# Patient Record
Sex: Male | Born: 1990 | Race: Black or African American | Hispanic: No | Marital: Single | Smoking: Current some day smoker
Health system: Southern US, Community
[De-identification: ages and names within clinical notes are randomized; demographics above are authoritative.]

## PROBLEM LIST (undated history)

## (undated) DIAGNOSIS — M5430 Sciatica, unspecified side: Secondary | ICD-10-CM

## (undated) DIAGNOSIS — G2581 Restless legs syndrome: Secondary | ICD-10-CM

## (undated) DIAGNOSIS — S79912A Unspecified injury of left hip, initial encounter: Secondary | ICD-10-CM

---

## 2018-01-13 ENCOUNTER — Emergency Department (HOSPITAL_COMMUNITY): Payer: Self-pay

## 2018-01-13 ENCOUNTER — Other Ambulatory Visit: Payer: Self-pay

## 2018-01-13 ENCOUNTER — Encounter (HOSPITAL_COMMUNITY): Payer: Self-pay | Admitting: Emergency Medicine

## 2018-01-13 ENCOUNTER — Emergency Department (HOSPITAL_COMMUNITY)
Admission: EM | Admit: 2018-01-13 | Discharge: 2018-01-13 | Disposition: A | Discharge status: Home / Self Care | Payer: Self-pay | Attending: Emergency Medicine | Admitting: Emergency Medicine

## 2018-01-13 DIAGNOSIS — T1490XA Injury, unspecified, initial encounter: Secondary | ICD-10-CM

## 2018-01-13 DIAGNOSIS — Y9301 Activity, walking, marching and hiking: Secondary | ICD-10-CM | POA: Insufficient documentation

## 2018-01-13 DIAGNOSIS — M545 Low back pain, unspecified: Secondary | ICD-10-CM

## 2018-01-13 DIAGNOSIS — M546 Pain in thoracic spine: Secondary | ICD-10-CM | POA: Insufficient documentation

## 2018-01-13 DIAGNOSIS — Y999 Unspecified external cause status: Secondary | ICD-10-CM | POA: Insufficient documentation

## 2018-01-13 DIAGNOSIS — F17200 Nicotine dependence, unspecified, uncomplicated: Secondary | ICD-10-CM | POA: Insufficient documentation

## 2018-01-13 DIAGNOSIS — M542 Cervicalgia: Secondary | ICD-10-CM | POA: Insufficient documentation

## 2018-01-13 DIAGNOSIS — Y929 Unspecified place or not applicable: Secondary | ICD-10-CM | POA: Insufficient documentation

## 2018-01-13 DIAGNOSIS — M79605 Pain in left leg: Secondary | ICD-10-CM | POA: Insufficient documentation

## 2018-01-13 DIAGNOSIS — M79604 Pain in right leg: Secondary | ICD-10-CM | POA: Insufficient documentation

## 2018-01-13 HISTORY — DX: Restless legs syndrome: G25.81

## 2018-01-13 HISTORY — DX: Unspecified injury of left hip, initial encounter: S79.912A

## 2018-01-13 HISTORY — DX: Sciatica, unspecified side: M54.30

## 2018-01-13 LAB — CBC
HEMATOCRIT: 42 % (ref 39.0–52.0)
HEMOGLOBIN: 13.8 g/dL (ref 13.0–17.0)
MCH: 26.7 pg (ref 26.0–34.0)
MCHC: 32.9 g/dL (ref 30.0–36.0)
MCV: 81.2 fL (ref 78.0–100.0)
Platelets: 298 10*3/uL (ref 150–400)
RBC: 5.17 MIL/uL (ref 4.22–5.81)
RDW: 15 % (ref 11.5–15.5)
WBC: 9.8 10*3/uL (ref 4.0–10.5)

## 2018-01-13 LAB — COMPREHENSIVE METABOLIC PANEL
ALT: 31 U/L (ref 0–44)
ANION GAP: 16 — AB (ref 5–15)
AST: 37 U/L (ref 15–41)
Albumin: 4.2 g/dL (ref 3.5–5.0)
Alkaline Phosphatase: 82 U/L (ref 38–126)
BILIRUBIN TOTAL: 0.6 mg/dL (ref 0.3–1.2)
BUN: 8 mg/dL (ref 6–20)
CHLORIDE: 106 mmol/L (ref 98–111)
CO2: 19 mmol/L — ABNORMAL LOW (ref 22–32)
Calcium: 9.3 mg/dL (ref 8.9–10.3)
Creatinine, Ser: 1.3 mg/dL — ABNORMAL HIGH (ref 0.61–1.24)
Glucose, Bld: 91 mg/dL (ref 70–99)
Potassium: 4.3 mmol/L (ref 3.5–5.1)
Sodium: 141 mmol/L (ref 135–145)
TOTAL PROTEIN: 7.4 g/dL (ref 6.5–8.1)

## 2018-01-13 LAB — CDS SEROLOGY

## 2018-01-13 LAB — I-STAT CHEM 8, ED
BUN: 9 mg/dL (ref 6–20)
CALCIUM ION: 1.09 mmol/L — AB (ref 1.15–1.40)
CREATININE: 1.2 mg/dL (ref 0.61–1.24)
Chloride: 106 mmol/L (ref 98–111)
GLUCOSE: 89 mg/dL (ref 70–99)
HEMATOCRIT: 43 % (ref 39.0–52.0)
HEMOGLOBIN: 14.6 g/dL (ref 13.0–17.0)
Potassium: 4.4 mmol/L (ref 3.5–5.1)
Sodium: 140 mmol/L (ref 135–145)
TCO2: 22 mmol/L (ref 22–32)

## 2018-01-13 LAB — PROTIME-INR
INR: 0.96
PROTHROMBIN TIME: 12.7 s (ref 11.4–15.2)

## 2018-01-13 LAB — SAMPLE TO BLOOD BANK

## 2018-01-13 LAB — ETHANOL: Alcohol, Ethyl (B): 52 mg/dL — ABNORMAL HIGH (ref <10)

## 2018-01-13 LAB — I-STAT CG4 LACTIC ACID, ED: LACTIC ACID, VENOUS: 3.87 mmol/L — AB (ref 0.5–1.9)

## 2018-01-13 MED ORDER — FENTANYL CITRATE (PF) 100 MCG/2ML IJ SOLN
INTRAMUSCULAR | Status: AC
  Administered 2018-01-13: 100 ug via INTRAVENOUS
  Filled 2018-01-13: qty 2

## 2018-01-13 MED ORDER — FENTANYL CITRATE (PF) 100 MCG/2ML IJ SOLN
50.0000 ug | Freq: Once | INTRAMUSCULAR | Status: AC
  Administered 2018-01-13: 100 ug via INTRAVENOUS

## 2018-01-13 MED ORDER — SODIUM CHLORIDE 0.9 % IV BOLUS
1000.0000 mL | Freq: Once | INTRAVENOUS | Status: AC
  Administered 2018-01-13: 1000 mL via INTRAVENOUS

## 2018-01-13 MED ORDER — KETOROLAC TROMETHAMINE 15 MG/ML IJ SOLN
15.0000 mg | Freq: Once | INTRAMUSCULAR | Status: AC
  Administered 2018-01-13: 15 mg via INTRAVENOUS
  Filled 2018-01-13: qty 1

## 2018-01-13 MED ORDER — IBUPROFEN 600 MG PO TABS: 600.0000 mg | ORAL_TABLET | Freq: Four times a day (QID) | ORAL | 0 refills | Status: AC | PRN

## 2018-01-13 MED ORDER — ACETAMINOPHEN 500 MG PO TABS: 1000.0000 mg | ORAL_TABLET | Freq: Three times a day (TID) | ORAL | 0 refills | Status: AC

## 2018-01-13 MED ORDER — FENTANYL CITRATE (PF) 100 MCG/2ML IJ SOLN
INTRAMUSCULAR | Status: AC
  Administered 2018-01-13: 50 ug
  Filled 2018-01-13: qty 2

## 2018-01-13 MED ORDER — SODIUM CHLORIDE 0.9 % IV SOLN: INTRAVENOUS | Status: DC

## 2018-01-13 MED ORDER — IOPAMIDOL (ISOVUE-300) INJECTION 61%
100.0000 mL | Freq: Once | INTRAVENOUS | Status: AC | PRN
  Administered 2018-01-13: 100 mL via INTRAVENOUS

## 2018-01-13 MED ORDER — FENTANYL CITRATE (PF) 100 MCG/2ML IJ SOLN: 50.0000 ug | Freq: Once | INTRAMUSCULAR | Status: DC
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
## 2018-01-13 NOTE — Discharge Instructions (Signed)
Apply Ice to your lower extremities as this will help with decreasing the inflammation and pain.
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
## 2018-04-11 ENCOUNTER — Encounter (HOSPITAL_COMMUNITY): Payer: Self-pay | Admitting: *Deleted

## 2018-04-11 ENCOUNTER — Emergency Department (HOSPITAL_COMMUNITY)
Admission: EM | Admit: 2018-04-11 | Discharge: 2018-04-11 | Disposition: A | Discharge status: Home / Self Care | Payer: Self-pay | Attending: Emergency Medicine | Admitting: Emergency Medicine

## 2018-04-11 DIAGNOSIS — F17200 Nicotine dependence, unspecified, uncomplicated: Secondary | ICD-10-CM | POA: Insufficient documentation

## 2018-04-11 DIAGNOSIS — R202 Paresthesia of skin: Secondary | ICD-10-CM | POA: Insufficient documentation

## 2018-04-11 DIAGNOSIS — K029 Dental caries, unspecified: Secondary | ICD-10-CM | POA: Insufficient documentation

## 2018-04-11 MED ORDER — PENICILLIN V POTASSIUM 500 MG PO TABS: 500.0000 mg | ORAL_TABLET | Freq: Four times a day (QID) | ORAL | 0 refills | Status: AC

## 2018-04-11 MED ORDER — KETOROLAC TROMETHAMINE 10 MG PO TABS
30.0000 mg | ORAL_TABLET | Freq: Once | ORAL | Status: AC
  Administered 2018-04-11: 30 mg via ORAL
  Filled 2018-04-11: qty 3

## 2018-04-11 MED ORDER — NAPROXEN 500 MG PO TABS: 500.0000 mg | ORAL_TABLET | Freq: Two times a day (BID) | ORAL | 0 refills | Status: AC

## 2018-04-11 NOTE — ED Triage Notes (Signed)
Pt c/o L lower tooth pain and chin numbness onset x 2 days, pt denies fever and chills, A& O X4

## 2018-04-11 NOTE — Discharge Instructions (Signed)
Please read instructions below. °Take the antibiotic, Penicillin V, 4 times per day until they are gone. °You can take Naproxen up to 2 times per day with meals, as needed for pain. °Schedule an appointment with a dentist, using the dental resource guide attached. °Return to the ER for difficulty swallowing or breathing, fever, or new or worsening symptoms. ° °
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
